# Patient Record
Sex: Male | Born: 1970 | Race: White | Hispanic: No | Marital: Married | Smoking: Never smoker
Health system: Southern US, Community
[De-identification: ages and names within clinical notes are randomized; demographics above are authoritative.]

## PROBLEM LIST (undated history)

## (undated) DIAGNOSIS — G609 Hereditary and idiopathic neuropathy, unspecified: Secondary | ICD-10-CM

## (undated) DIAGNOSIS — E785 Hyperlipidemia, unspecified: Secondary | ICD-10-CM

## (undated) DIAGNOSIS — Z87898 Personal history of other specified conditions: Secondary | ICD-10-CM

## (undated) HISTORY — DX: Hereditary and idiopathic neuropathy, unspecified: G60.9

## (undated) HISTORY — PX: EYE SURGERY: SHX253

## (undated) HISTORY — DX: Hyperlipidemia, unspecified: E78.5

## (undated) HISTORY — PX: APPENDECTOMY: SHX54

## (undated) HISTORY — PX: ANTERIOR CRUCIATE LIGAMENT REPAIR: SHX115

## (undated) HISTORY — DX: Personal history of other specified conditions: Z87.898

---

## 1998-05-22 ENCOUNTER — Ambulatory Visit (HOSPITAL_BASED_OUTPATIENT_CLINIC_OR_DEPARTMENT_OTHER): Admission: RE | Admit: 1998-05-22 | Discharge: 1998-05-22 | Payer: Self-pay | Admitting: Ophthalmology

## 2002-06-04 ENCOUNTER — Encounter: Payer: Self-pay | Admitting: Family Medicine

## 2002-06-04 ENCOUNTER — Encounter: Admission: RE | Admit: 2002-06-04 | Discharge: 2002-06-04 | Payer: Self-pay | Admitting: Family Medicine

## 2006-07-14 ENCOUNTER — Ambulatory Visit: Payer: Self-pay | Admitting: Family Medicine

## 2007-06-16 ENCOUNTER — Ambulatory Visit (HOSPITAL_COMMUNITY): Admission: RE | Admit: 2007-06-16 | Discharge: 2007-06-16 | Payer: Self-pay | Admitting: Orthopedic Surgery

## 2008-07-23 ENCOUNTER — Ambulatory Visit: Payer: Self-pay | Admitting: Family Medicine

## 2008-07-23 DIAGNOSIS — M25519 Pain in unspecified shoulder: Secondary | ICD-10-CM

## 2008-07-24 ENCOUNTER — Encounter: Payer: Self-pay | Admitting: Family Medicine

## 2008-07-24 LAB — CONVERTED CEMR LAB
ALT: 31 units/L (ref 0–53)
AST: 16 units/L (ref 0–37)
Albumin: 4.6 g/dL (ref 3.5–5.2)
Alkaline Phosphatase: 88 units/L (ref 39–117)
BUN: 11 mg/dL (ref 6–23)
CO2: 23 meq/L (ref 19–32)
Calcium: 9.2 mg/dL (ref 8.4–10.5)
Chloride: 103 meq/L (ref 96–112)
Cholesterol, target level: 200 mg/dL
Cholesterol: 231 mg/dL — ABNORMAL HIGH (ref 0–200)
Creatinine, Ser: 0.94 mg/dL (ref 0.40–1.50)
Glucose, Bld: 99 mg/dL (ref 70–99)
HDL goal, serum: 40 mg/dL
HDL: 34 mg/dL — ABNORMAL LOW (ref >39)
LDL Cholesterol: 123 mg/dL — ABNORMAL HIGH (ref 0–99)
LDL Goal: 160 mg/dL
Potassium: 4.4 meq/L (ref 3.5–5.3)
Sodium: 140 meq/L (ref 135–145)
TSH: 1.839 microintl units/mL (ref 0.350–4.50)
Total Bilirubin: 0.5 mg/dL (ref 0.3–1.2)
Total CHOL/HDL Ratio: 6.8
Total Protein: 7.5 g/dL (ref 6.0–8.3)
Triglycerides: 372 mg/dL — ABNORMAL HIGH (ref <150)
VLDL: 74 mg/dL — ABNORMAL HIGH (ref 0–40)

## 2016-06-18 ENCOUNTER — Encounter: Payer: Self-pay | Admitting: *Deleted

## 2016-06-18 ENCOUNTER — Emergency Department (INDEPENDENT_AMBULATORY_CARE_PROVIDER_SITE_OTHER): Payer: BC Managed Care – PPO

## 2016-06-18 ENCOUNTER — Emergency Department
Admission: EM | Admit: 2016-06-18 | Discharge: 2016-06-18 | Disposition: A | Discharge status: Home / Self Care | Payer: BC Managed Care – PPO | Source: Home / Self Care | Attending: Family Medicine | Admitting: Family Medicine

## 2016-06-18 DIAGNOSIS — N50812 Left testicular pain: Secondary | ICD-10-CM

## 2016-06-18 DIAGNOSIS — N433 Hydrocele, unspecified: Secondary | ICD-10-CM

## 2016-06-18 DIAGNOSIS — M545 Low back pain, unspecified: Secondary | ICD-10-CM

## 2016-06-18 DIAGNOSIS — N503 Cyst of epididymis: Secondary | ICD-10-CM

## 2016-06-18 LAB — POCT URINALYSIS DIP (MANUAL ENTRY)
Bilirubin, UA: NEGATIVE
Blood, UA: NEGATIVE
Glucose, UA: NEGATIVE
Ketones, POC UA: NEGATIVE
Leukocytes, UA: NEGATIVE
Nitrite, UA: NEGATIVE
Protein Ur, POC: NEGATIVE
Spec Grav, UA: 1.015 (ref 1.005–1.03)
Urobilinogen, UA: 0.2 (ref 0–1)
pH, UA: 8 (ref 5–8)

## 2016-06-18 NOTE — ED Notes (Signed)
Pt c/o lower left back pain x 2-3 days that radiates to his testicles. Denies any dysuria or hematuria or testicular swelling. Pain is worse in the evening and with raising his left leg.

## 2016-06-18 NOTE — Discharge Instructions (Signed)
You may alternate acetaminophen and ibuprofen for pain as well as alternate cool and warm compresses to help with discomfort.  Avoid heavy leg lifting or squats for 3-4 days, or until pain resolves to help with healing.  If not improving please follow up with your family doctor or Sports medicine for further evaluation.

## 2016-06-18 NOTE — ED Provider Notes (Signed)
CSN: [id number]     Arrival date & time 06/18/16  1021 History   First MD Initiated Contact with Patient 06/18/16 1027     Chief Complaint  Patient presents with  . Back Pain   (Consider location/radiation/quality/duration/timing/severity/associated sxs/prior Treatment) HPI  Devin Greene is a 45 y.o. male presenting to UC with c/o Left sided lower back pain as well as Left sided testicular pain for about 2-3 days. Pain described as a squeezing tight and sore sensation, has been constant since onset, waxing and waning, 4/10 at this time, worse with certain movements such as lifting Left leg, worsens pain in left testicle.  He reports going to the gym the other day prior to symptoms starting and lifted a new weight with a leg press and wonders if that caused his current pain. Advil provides moderate pain relief.  Denies pain with urination or hematuria. No prior hx of hernias or kidney stones. Denies fever, n/v/d.    History reviewed. No pertinent past medical history. Past Surgical History  Procedure Laterality Date  . Appendectomy    . Eye surgery    . Anterior cruciate ligament repair     Family History  Problem Relation Age of Onset  . Cancer Mother     Colon CA   Social History  Substance Use Topics  . Smoking status: Never Smoker   . Smokeless tobacco: Never Used  . Alcohol Use: Yes    Review of Systems  Constitutional: Negative for fever and chills.  Gastrointestinal: Negative for nausea, vomiting, abdominal pain and diarrhea.  Genitourinary: Positive for testicular pain (Left). Negative for dysuria, urgency, frequency, hematuria, flank pain, decreased urine volume, discharge, penile swelling, scrotal swelling and penile pain.  Musculoskeletal: Positive for myalgias and back pain ( Left lower). Negative for gait problem.  Skin: Negative for color change and wound.  Neurological: Negative for weakness and numbness.    Allergies  Review of patient's allergies indicates  no known allergies.  Home Medications   Prior to Admission medications   Not on File   Meds Ordered and Administered this Visit  Medications - No data to display  BP 143/89 mmHg  Pulse 72  Ht 5\' 7"  (1.702 m)  Wt 197 lb (89.359 kg)  BMI 30.85 kg/m2  SpO2 99% No data found.   Physical Exam  Constitutional: He is oriented to person, place, and time. He appears well-developed and well-nourished.  HENT:  Head: Normocephalic and atraumatic.  Eyes: EOM are normal.  Neck: Normal range of motion.  Cardiovascular: Normal rate.   Pulmonary/Chest: Effort normal.  Abdominal: Soft. He exhibits no distension and no mass. There is no tenderness. There is no rebound and no guarding. Hernia confirmed negative in the right inguinal area and confirmed negative in the left inguinal area.  Genitourinary: Penis normal. Right testis shows no mass, no swelling and no tenderness. Left testis shows tenderness. Left testis shows no mass and no swelling. Circumcised. No penile erythema or penile tenderness.  Musculoskeletal: Normal range of motion. He exhibits tenderness. He exhibits no edema.  No midline spinal tenderness. Tenderness to Left lower lumbar muscles. Negative straight leg raise. Full ROM upper and lower extremities with 5/5 strength. Normal gait.   Neurological: He is alert and oriented to person, place, and time.  Skin: Skin is warm and dry.  Psychiatric: He has a normal mood and affect. His behavior is normal.  Nursing note and vitals reviewed.   ED Course  Procedures (including critical care  time)  Labs Review Labs Reviewed  POCT URINALYSIS DIP (MANUAL ENTRY)    Imaging Review [place] Scrotum  06/18/2016  CLINICAL DATA:  Lower back pain that radiates to his testicles. Denies dysuria or hematuria. EXAM: ULTRASOUND OF SCROTUM TECHNIQUE: Complete ultrasound examination of the testicles, epididymis, and other scrotal structures was performed. COMPARISON:  None. FINDINGS: Right testicle  Measurements: 3.8 x 2.1 x 2.7 cm. No mass or microlithiasis visualized. Left testicle Measurements: 4.0 x 1.9 x 2.7 cm. No mass or microlithiasis visualized. Right epididymis: There is a benign appearing epididymal cyst measuring 8 x 6 x 6 mm. Left epididymis:  Normal in size and appearance. Hydrocele:  Bilateral hydroceles noted. Varicocele:  None visualized. IMPRESSION: 1. No evidence for testicular torsion or mass. 2. Bilateral hydroceles 3. Right epididymal cysts. Electronically Signed   By: Signa Kellaylor  Stroud M.D.   On: 06/18/2016 11:51   [place] Art/ven Flow Abd Pelv Doppler  06/18/2016  CLINICAL DATA:  Lower back pain that radiates to his testicles. Denies dysuria or hematuria. EXAM: ULTRASOUND OF SCROTUM TECHNIQUE: Complete ultrasound examination of the testicles, epididymis, and other scrotal structures was performed. COMPARISON:  None. FINDINGS: Right testicle Measurements: 3.8 x 2.1 x 2.7 cm. No mass or microlithiasis visualized. Left testicle Measurements: 4.0 x 1.9 x 2.7 cm. No mass or microlithiasis visualized. Right epididymis: There is a benign appearing epididymal cyst measuring 8 x 6 x 6 mm. Left epididymis:  Normal in size and appearance. Hydrocele:  Bilateral hydroceles noted. Varicocele:  None visualized. IMPRESSION: 1. No evidence for testicular torsion or mass. 2. Bilateral hydroceles 3. Right epididymal cysts. Electronically Signed   By: Signa Kellaylor  Stroud M.D.   On: 06/18/2016 11:51      MDM   1. Bilateral hydrocele   2. Left testicular pain   3. Left-sided low back pain without sciatica    Pt c/o Left lower back pain and Left sided testicular pain. No midline spinal tenderness. No red flag symptoms of numbness or weakness in Left leg. No gait change. No fevers. No change in bowel or bladder habits.  Tenderness to Left lumbar muscles and Left side of scrotum. No masses or hernias palpated.   U/S scrotum to r/o testicular torsion- negative for torsion. Significant for bilateral  hyrocele.  Pt denies concern for STDs or hx of prostate issues.  UA- normal, no evidence of infection or blood.  Reassured pt hydrocele will likely resolve on its own. May have muscle strain in lower back and groin. Avoid heavy lifting of lower extremities such as squats and leg press for 3-4 days/until pain resolves.  Encouraged alternating acetaminophen, ibuprofen, and cool and warm compresses. Home care instructions provided. F/u with PCP in 3-4 days if not improving. If pain worsens significantly this weekend, difficulty walking or urinating, or other new concerning symptoms developed, encouraged to go to emergency department. Patient verbalized understanding and agreement with treatment plan.   Junius Finnerrin O'Malley, PA-C 06/18/16 1227

## 2016-09-07 IMAGING — US US SCROTUM
1 series · 14 of 25 positions shown · non-contrast
Comparison: None.

CLINICAL DATA: Lower back pain that radiates to his testicles.
Denies dysuria or hematuria.

EXAM:
ULTRASOUND OF SCROTUM
TECHNIQUE: Complete ultrasound examination of the testicles, epididymis, and
other scrotal structures was performed.

[Series 1: us scrotum · 0.07mm/px · 14 of 66 slices shown]
[im 1/66]
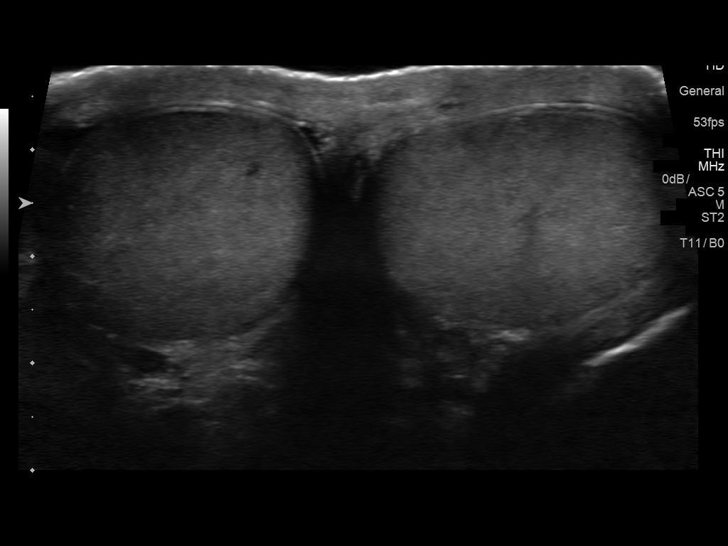
[im 6/66]
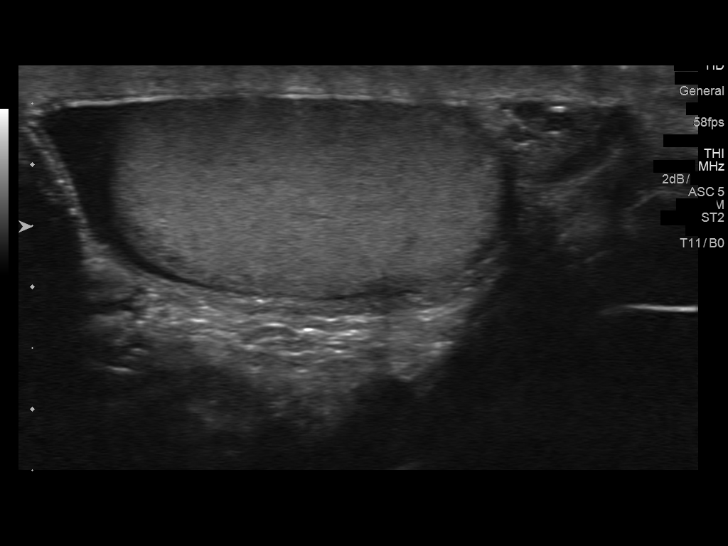
[im 11/66]
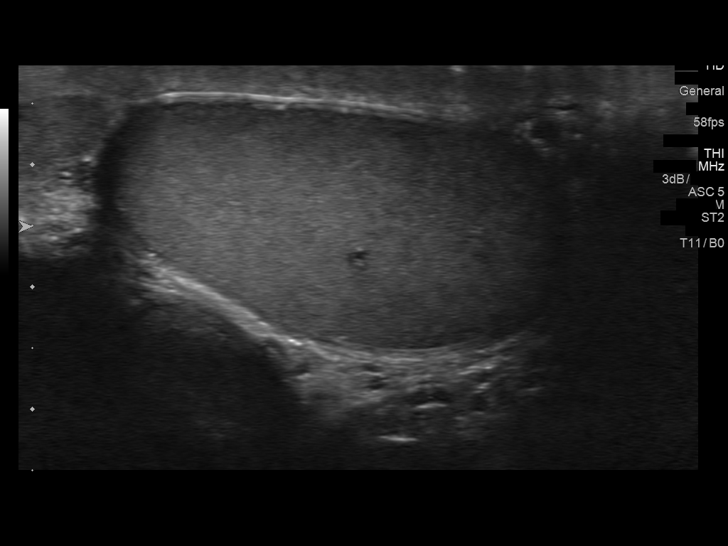
[im 17/66]
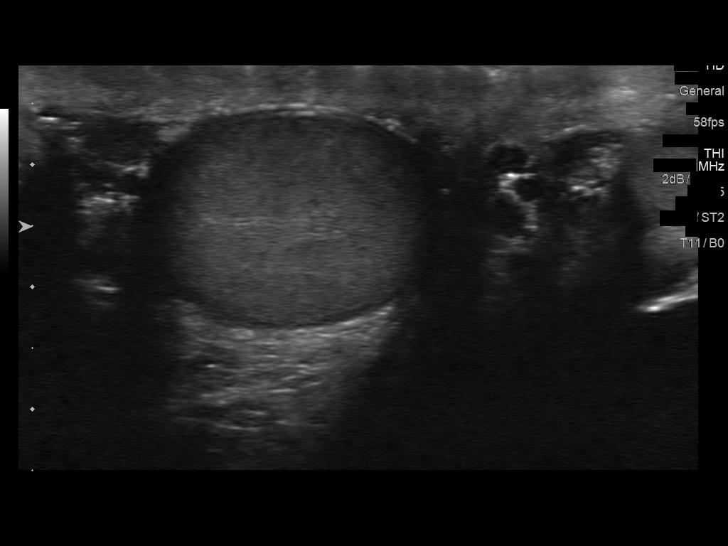
[im 22/66]
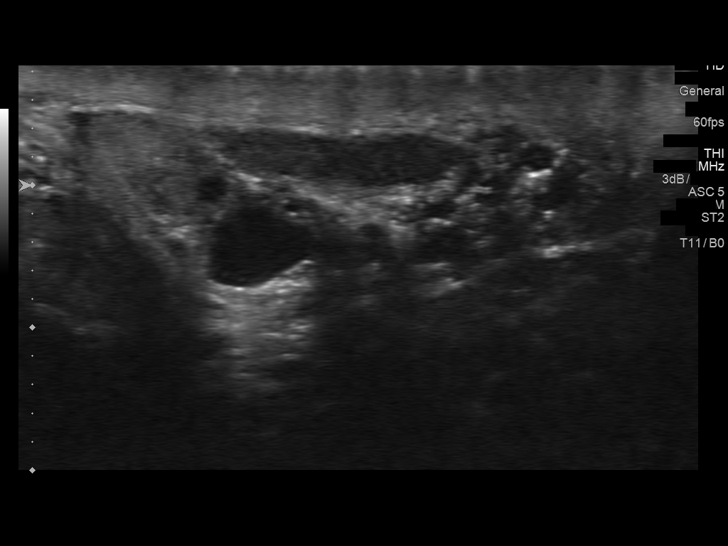
[im 25/66]
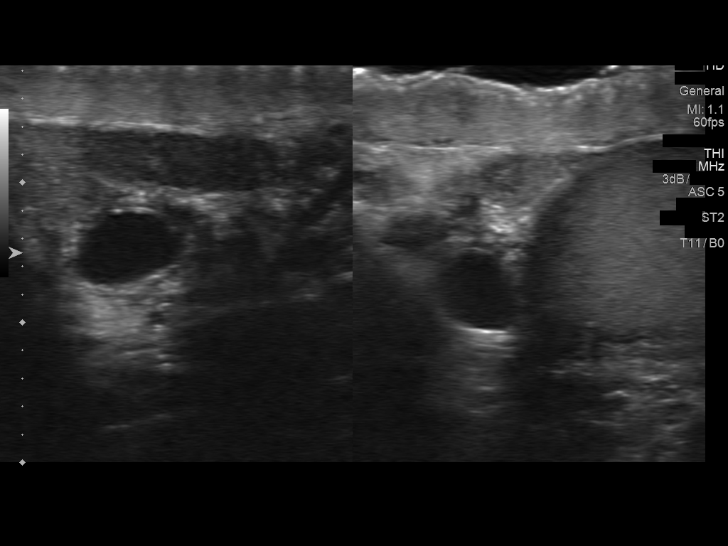
[im 30/66]
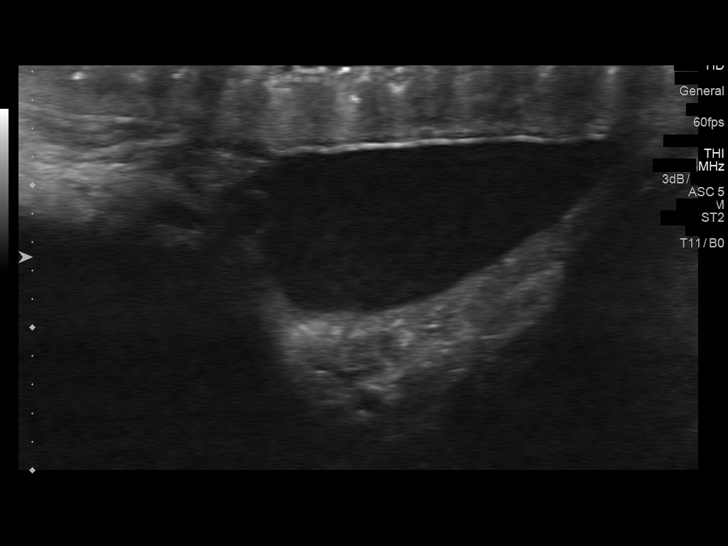
[im 36/66]
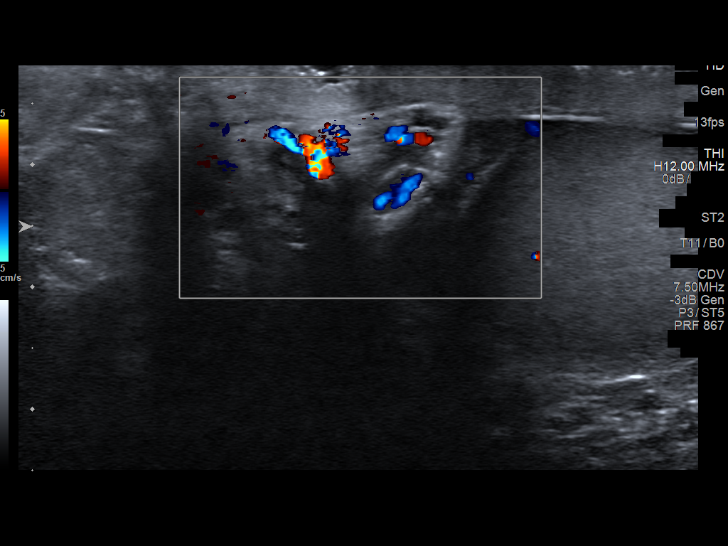
[im 41/66]
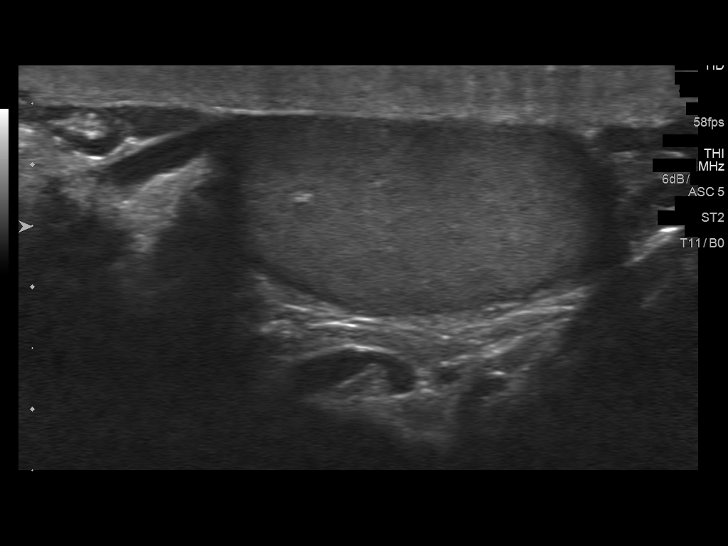
[im 44/66]
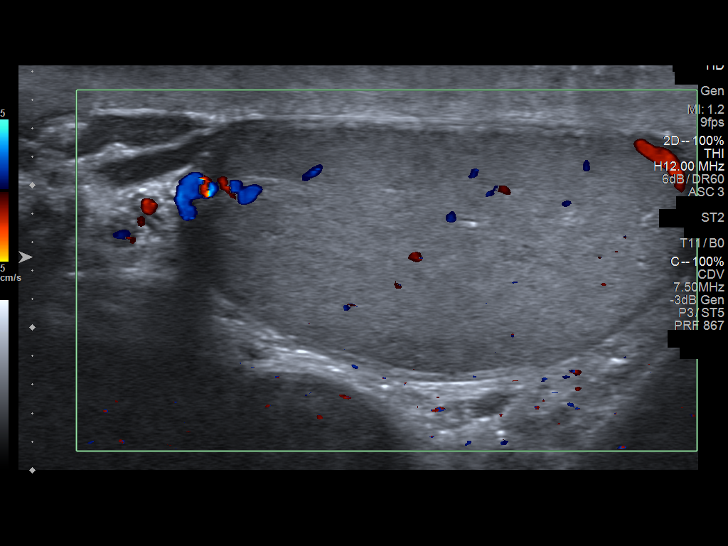
[im 49/66]
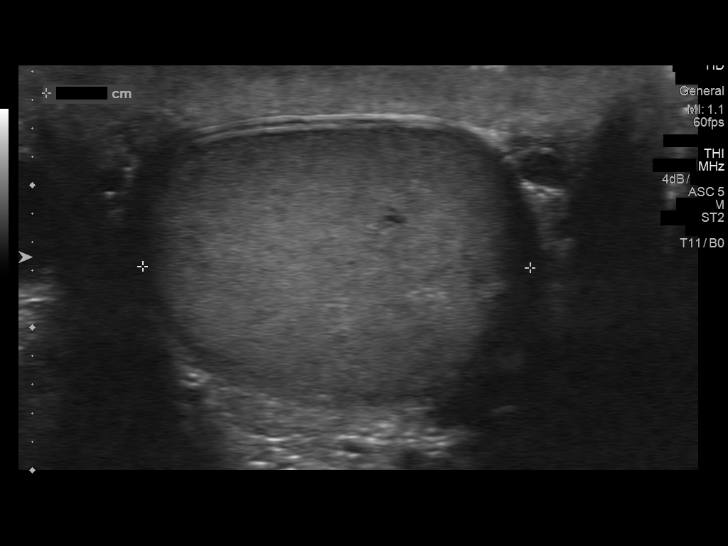
[im 55/66]
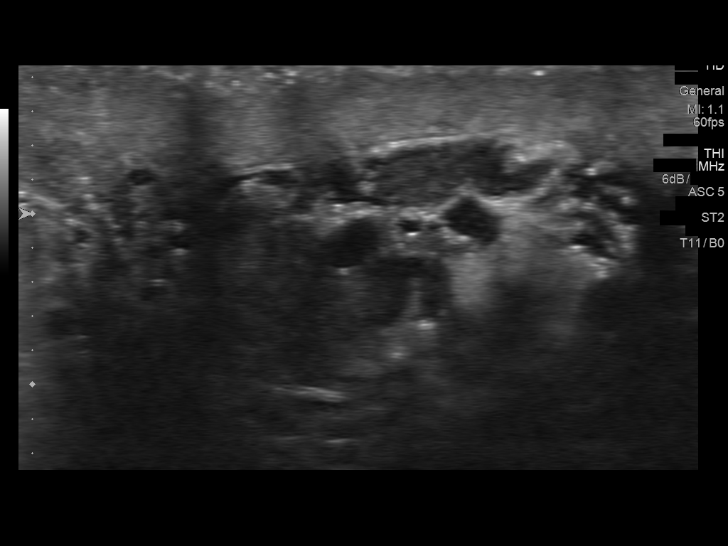
[im 60/66]
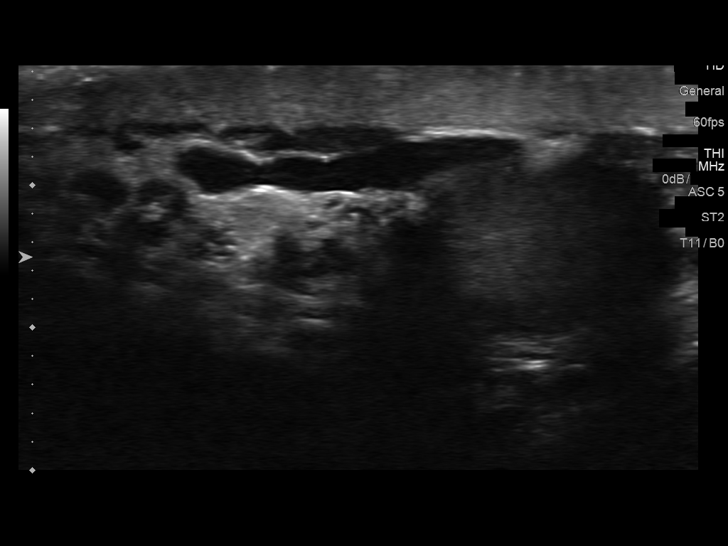
[im 66/66]
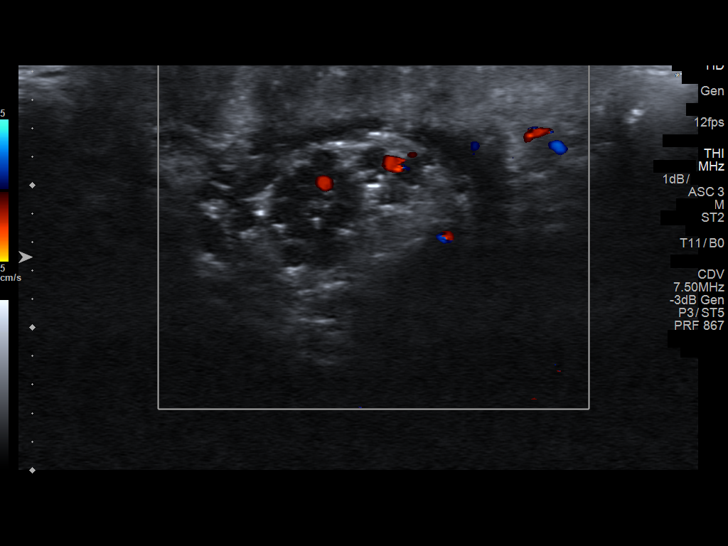

[14 of 25 positions shown; findings below may reference images not displayed]

FINDINGS: Right testicle

Measurements: 3.8 x 2.1 x 2.7 cm. No mass or microlithiasis
visualized.

Left testicle

Measurements: 4.0 x 1.9 x 2.7 cm. No mass or microlithiasis
visualized.

Right epididymis: There is a benign appearing epididymal cyst
measuring 8 x 6 x 6 mm.

Left epididymis:  Normal in size and appearance.

Hydrocele:  Bilateral hydroceles noted.

Varicocele:  None visualized.
IMPRESSION: 1. No evidence for testicular torsion or mass.
2. Bilateral hydroceles
3. Right epididymal cysts.

## 2020-01-02 ENCOUNTER — Ambulatory Visit: Payer: BC Managed Care – PPO | Admitting: Neurology

## 2020-02-08 ENCOUNTER — Other Ambulatory Visit: Payer: Self-pay

## 2020-02-08 ENCOUNTER — Encounter: Payer: Self-pay | Admitting: Neurology

## 2020-02-08 ENCOUNTER — Ambulatory Visit: Payer: BC Managed Care – PPO | Admitting: Neurology

## 2020-02-08 VITALS — BP 139/95 | HR 74 | Temp 97.7°F | Ht 66.0 in | Wt 189.0 lb

## 2020-02-08 DIAGNOSIS — G609 Hereditary and idiopathic neuropathy, unspecified: Secondary | ICD-10-CM | POA: Diagnosis not present

## 2020-02-08 NOTE — Patient Instructions (Addendum)
Blood work EMG/NCS   Peripheral Neuropathy Peripheral neuropathy is a type of nerve damage. It affects nerves that carry signals between the spinal cord and the arms, legs, and the rest of the body (peripheral nerves). It does not affect nerves in the spinal cord or brain. In peripheral neuropathy, one nerve or a group of nerves may be damaged. Peripheral neuropathy is a broad category that includes many specific nerve disorders, like diabetic neuropathy, hereditary neuropathy, and carpal tunnel syndrome. What are the causes? This condition may be caused by:  Diabetes. This is the most common cause of peripheral neuropathy.  Nerve injury.  Pressure or stress on a nerve that lasts a long time.  Lack (deficiency) of B vitamins. This can result from alcoholism, poor diet, or a restricted diet.  Infections.  Autoimmune diseases, such as rheumatoid arthritis and systemic lupus erythematosus.  Nerve diseases that are passed from parent to child (inherited).  Some medicines, such as cancer medicines (chemotherapy).  Poisonous (toxic) substances, such as lead and mercury.  Too little blood flowing to the legs.  Kidney disease.  Thyroid disease. In some cases, the cause of this condition is not known. What are the signs or symptoms? Symptoms of this condition depend on which of your nerves is damaged. Common symptoms include:  Loss of feeling (numbness) in the feet, hands, or both.  Tingling in the feet, hands, or both.  Burning pain.  Very sensitive skin.  Weakness.  Not being able to move a part of the body (paralysis).  Muscle twitching.  Clumsiness or poor coordination.  Loss of balance.  Not being able to control your bladder.  Feeling dizzy.  Sexual problems. How is this diagnosed? Diagnosing and finding the cause of peripheral neuropathy can be difficult. Your health care provider will take your medical history and do a physical exam. A neurological exam will  also be done. This involves checking things that are affected by your brain, spinal cord, and nerves (nervous system). For example, your health care provider will check your reflexes, how you move, and what you can feel. You may have other tests, such as:  Blood tests.  Electromyogram (EMG) and nerve conduction tests. These tests check nerve function and how well the nerves are controlling the muscles.  Imaging tests, such as CT scans or MRI to rule out other causes of your symptoms.  Removing a small piece of nerve to be examined in a lab (nerve biopsy). This is rare.  Removing and examining a small amount of the fluid that surrounds the brain and spinal cord (lumbar puncture). This is rare. How is this treated? Treatment for this condition may involve:  Treating the underlying cause of the neuropathy, such as diabetes, kidney disease, or vitamin deficiencies.  Stopping medicines that can cause neuropathy, such as chemotherapy.  Medicine to relieve pain. Medicines may include: ? Prescription or over-the-counter pain medicine. ? Antiseizure medicine. ? Antidepressants. ? Pain-relieving patches that are applied to painful areas of skin.  Surgery to relieve pressure on a nerve or to destroy a nerve that is causing pain.  Physical therapy to help improve movement and balance.  Devices to help you move around (assistive devices). Follow these instructions at home: Medicines  Take over-the-counter and prescription medicines only as told by your health care provider. Do not take any other medicines without first asking your health care provider.  Do not drive or use heavy machinery while taking prescription pain medicine. Lifestyle   Do not use  any products that contain nicotine or tobacco, such as cigarettes and e-cigarettes. Smoking keeps blood from reaching damaged nerves. If you need help quitting, ask your health care provider.  Avoid or limit alcohol. Too much alcohol can  cause a vitamin B deficiency, and vitamin B is needed for healthy nerves.  Eat a healthy diet. This includes: ? Eating foods that are high in fiber, such as fresh fruits and vegetables, whole grains, and beans. ? Limiting foods that are high in fat and processed sugars, such as fried or sweet foods. General instructions   If you have diabetes, work closely with your health care provider to keep your blood sugar under control.  If you have numbness in your feet: ? Check every day for signs of injury or infection. Watch for redness, warmth, and swelling. ? Wear padded socks and comfortable shoes. These help protect your feet.  Develop a good support system. Living with peripheral neuropathy can be stressful. Consider talking with a mental health specialist or joining a support group.  Use assistive devices and attend physical therapy as told by your health care provider. This may include using a walker or a cane.  Keep all follow-up visits as told by your health care provider. This is important. Contact a health care provider if:  You have new signs or symptoms of peripheral neuropathy.  You are struggling emotionally from dealing with peripheral neuropathy.  Your pain is not well-controlled. Get help right away if:  You have an injury or infection that is not healing normally.  You develop new weakness in an arm or leg.  You fall frequently. Summary  Peripheral neuropathy is when the nerves in the arms, or legs are damaged, resulting in numbness, weakness, or pain.  There are many causes of peripheral neuropathy, including diabetes, pinched nerves, vitamin deficiencies, autoimmune disease, and hereditary conditions.  Diagnosing and finding the cause of peripheral neuropathy can be difficult. Your health care provider will take your medical history, do a physical exam, and do tests, including blood tests and nerve function tests.  Treatment involves treating the underlying  cause of the neuropathy and taking medicines to help control pain. Physical therapy and assistive devices may also help. This information is not intended to replace advice given to you by your health care provider. Make sure you discuss any questions you have with your health care provider. Document Revised: 11/26/2017 Document Reviewed: 02/22/2017 Elsevier Patient Education  2020 [employer]    Electromyoneurogram Electromyoneurogram is a test to check how well your muscles and nerves are working. This procedure includes the combined use of electromyogram (EMG) and nerve conduction study (NCS). EMG is used to look for muscular disorders. NCS, which is also called electroneurogram, measures how well your nerves are controlling your muscles. The procedures are usually done together to check if your muscles and nerves are healthy. If the results of the tests are abnormal, this may indicate disease or injury, such as a neuromuscular disease or peripheral nerve damage. Tell a health care provider about:  Any allergies you have.  All medicines you are taking, including vitamins, herbs, eye drops, creams, and over-the-counter medicines.  Any problems you or family members have had with anesthetic medicines.  Any blood disorders you have.  Any surgeries you have had.  Any medical conditions you have.  If you have a pacemaker.  Whether you are pregnant or may be pregnant. What are the risks? Generally, this is a safe procedure. However, problems may occur,  including:  Infection where the electrodes were inserted.  Bleeding. What happens before the procedure? Medicines Ask your health care provider about:  Changing or stopping your regular medicines. This is especially important if you are taking diabetes medicines or blood thinners.  Taking medicines such as aspirin and ibuprofen. These medicines can thin your blood. Do not take these medicines unless your health care provider tells  you to take them.  Taking over-the-counter medicines, vitamins, herbs, and supplements. General instructions  Your health care provider may ask you to avoid: ? Beverages that have caffeine, such as coffee and tea. ? Any products that contain nicotine or tobacco. These products include cigarettes, e-cigarettes, and chewing tobacco. If you need help quitting, ask your health care provider.  Do not use lotions or creams on the same day that you will be having the procedure. What happens during the procedure? For EMG   Your health care provider will ask you to stay in a position so that he or she can access the muscle that will be studied. You may be standing, sitting, or lying down.  You may be given a medicine that numbs the area (local anesthetic).  A very thin needle that has an electrode will be inserted into your muscle.  Another small electrode will be placed on your skin near the muscle.  Your health care provider will ask you to continue to remain still.  The electrodes will send a signal that tells about the electrical activity of your muscles. You may see this on a monitor or hear it in the room.  After your muscles have been studied at rest, your health care provider will ask you to contract or flex your muscles. The electrodes will send a signal that tells about the electrical activity of your muscles.  Your health care provider will remove the electrodes and the electrode needles when the procedure is finished. The procedure may vary among health care providers and hospitals. For NCS   An electrode that records your nerve activity (recording electrode) will be placed on your skin by the muscle that is being studied.  An electrode that is used as a reference (reference electrode) will be placed near the recording electrode.  A paste or gel will be applied to your skin between the recording electrode and the reference electrode.  Your nerve will be stimulated with a  mild shock. Your health care provider will measure how much time it takes for your muscle to react.  Your health care provider will remove the electrodes and the gel when the procedure is finished. The procedure may vary among health care providers and hospitals. What happens after the procedure?  It is up to you to get the results of your procedure. Ask your health care provider, or the department that is doing the procedure, when your results will be ready.  Your health care provider may: ? Give you medicines for any pain. ? Monitor the insertion sites to make sure that bleeding stops. Summary  Electromyoneurogram is a test to check how well your muscles and nerves are working.  If the results of the tests are abnormal, this may indicate disease or injury.  This is a safe procedure. However, problems may occur, such as bleeding and infection.  Your health care provider will do two tests to complete this procedure. One checks your muscles (EMG) and another checks your nerves (NCS).  It is up to you to get the results of your procedure. Ask your health  care provider, or the department that is doing the procedure, when your results will be ready. This information is not intended to replace advice given to you by your health care provider. Make sure you discuss any questions you have with your health care provider. Document Revised: 08/30/2018 Document Reviewed: 08/12/2018 Elsevier Patient Education  2020 [employer].

## 2020-02-08 NOTE — Progress Notes (Signed)
[facility]    Provider:  Dr Lucia Gaskins Requesting Provider: Tally Joe, MD Primary Care Provider:  Agapito Games, MD  CC:  Sensory problems in the feet  HPI:  Devin Greene is a 49 y.o. male here as requested by Agapito Games, * for neuropathy. PMHx HLD, idiopathic neuropathy, prediabetes. HgbA1c 5.8 last per record review.  I reviewed Dr. Merita Norton notes, patient has a history of hyperlipidemia most recent LDL was 181 on atorvastatin which had been previously discontinued to see if it might help with his bilateral foot numbness but it did not, he continues to have numbness over the balls of his feet to his toes, sometimes he feels like he might fall because of the numbness, he denies any low back pain, he confirms he had an nerve conduction study and a normal vitamin B12 level previously checked since onset of symptoms, he has a history of prediabetes, EMG nerve conduction study in the past for idiopathic peripheral neuropathy, EMG was normal per report, MRI of the brain was also normal by report.  MRI of the head and MRA of the head February 2018 was normal.  He may have undiagnosed high blood pressure and he does not check his readings at home.  Rare alcohol.  Never smoked.  He is here alone and reports symptoms started a year ago maybe longer. He started feeling burning sensation in the toes and bottom of the feet started in one and then to the other, symmetrically. Now both big toes have decreased sensation. Stopped the statin and maybe he was improved then started taking another statin, maybe its in his head he got better. He also feels sensory changes left lateral lower leg. He has had severe testicular pain a year go it was severe and couldn't find anything, it went away, he has lost some weight and then gained some back like most others at this time. He has had low back pain. The symptoms are slowly progressive. He has had 2 ACL repairs years ago and has had  numbness in the lower left lateral leg but now burning. Very little alcohol use. Father and paternal grandmother have idiopathic neuropathy. He does have LBP but nothing radicular and not there now. Father now has complete numbness in the feet, no significant FHx of diabetes. No characteristics in the family that he has noticed such as high arches or flat feet. The pins and needles and burning and cooling feeling is not always but the numbness in the pad of the foot is always there also the big toe is more affected. He bends down a lot, he was in the electrical trade business, the burning and cool sensation are mostly at night like when laying in bed or sitting on the couch. Nothing known to exacerbate or improve. No weakness. No ignificant FHx of autoimmune disorder. No other focal neurologic deficits, associated symptoms, inciting events or modifiable factors.  Review of Systems: Patient complains of symptoms per HPI as well as the following symptoms: numbness in feet. Pertinent negatives and positives per HPI. All others negative.   Social History   Socioeconomic History  . Marital status: Married    Spouse name: Not on file  . Number of children: 1  . Years of education: Not on file  . Highest education level: Some college, no degree  Occupational History  . Not on file  Tobacco Use  . Smoking status: Never Smoker  . Smokeless tobacco: Former Neurosurgeon    Types:  Chew  Substance and Sexual Activity  . Alcohol use: Yes    Alcohol/week: 2.0 standard drinks    Types: 2 Cans of beer per week  . Drug use: No  . Sexual activity: Not on file  Other Topics Concern  . Not on file  Social History Narrative   Lives at home with wife. Son is in college.   Right handed   Caffeine: 2 cups of coffee usually in the morning   Social Determinants of Health   Financial Resource Strain:   . Difficulty of Paying Living Expenses: Not on file  Food Insecurity:   . Worried About Charity fundraiser in  the Last Year: Not on file  . Ran Out of Food in the Last Year: Not on file  Transportation Needs:   . Lack of Transportation (Medical): Not on file  . Lack of Transportation (Non-Medical): Not on file  Physical Activity:   . Days of Exercise per Week: Not on file  . Minutes of Exercise per Session: Not on file  Stress:   . Feeling of Stress : Not on file  Social Connections:   . Frequency of Communication with Friends and Family: Not on file  . Frequency of Social Gatherings with Friends and Family: Not on file  . Attends Religious Services: Not on file  . Active Member of Clubs or Organizations: Not on file  . Attends Archivist Meetings: Not on file  . Marital Status: Not on file  Intimate Partner Violence:   . Fear of Current or Ex-Partner: Not on file  . Emotionally Abused: Not on file  . Physically Abused: Not on file  . Sexually Abused: Not on file    Family History  Problem Relation Age of Onset  . Cancer Mother        Colon CA  . Liver cancer Mother        mets to colon  . Hyperlipidemia Mother   . Hyperlipidemia Father   . Hypertension Father   . Neuropathy Father   . Neuropathy Paternal Grandmother     Past Medical History:  Diagnosis Date  . History of prediabetes   . Hyperlipidemia   . Idiopathic peripheral neuropathy     Patient Active Problem List   Diagnosis Date Noted  . Idiopathic polyneuropathy 02/08/2020  . SHOULDER PAIN, LEFT 07/23/2008    Past Surgical History:  Procedure Laterality Date  . ANTERIOR CRUCIATE LIGAMENT REPAIR Left    x2  . APPENDECTOMY    . EYE SURGERY      Current Outpatient Medications  Medication Sig Dispense Refill  . Cholecalciferol (VITAMIN D3) 125 MCG (5000 UT) TABS Take by mouth.    . Magnesium Citrate 200 MG TABS Take by mouth.    . Multiple Vitamin (MULTIVITAMIN PO) Take by mouth.    . Omega-3 Fatty Acids (FISH OIL) 1000 MG CAPS Take 1 capsule by mouth daily.    Marland Kitchen OVER THE COUNTER MEDICATION Take  1,200 mg by mouth 2 (two) times daily. Berberine    . rosuvastatin (CRESTOR) 5 MG tablet Take 5 mg by mouth once a week.    Marland Kitchen Ubiquinol 100 MG CAPS Take by mouth 2 (two) times daily.     No current facility-administered medications for this visit.    Allergies as of 02/08/2020 - Review Complete 02/08/2020  Allergen Reaction Noted  . Levofloxacin Other (See Comments) 08/05/2016    Vitals: BP (!) 139/95 (BP Location: Left Arm, Patient Position:  Sitting)   Pulse 74   Temp 97.7 F (36.5 C) Comment: taken at front  Ht 5\' 6"  (1.676 m)   Wt 189 lb (85.7 kg)   BMI 30.51 kg/m  Last Weight:  Wt Readings from Last 1 Encounters:  02/08/20 189 lb (85.7 kg)   Last Height:   Ht Readings from Last 1 Encounters:  02/08/20 5\' 6"  (1.676 m)     Physical exam: Exam: Gen: NAD, conversant, well nourised,  well groomed                     CV: RRR, no MRG. No Carotid Bruits. No peripheral edema, warm, nontender Eyes: Conjunctivae clear without exudates or hemorrhage  Neuro: Detailed Neurologic Exam  Speech:    Speech is normal; fluent and spontaneous with normal comprehension.  Cognition:    The patient is oriented to person, place, and time;     recent and remote memory intact;     language fluent;     normal attention, concentration,     fund of knowledge Cranial Nerves:    The pupils are equal, round, and reactive to light. The fundi are normal and spontaneous venous pulsations are present. Visual fields are full to finger confrontation. Extraocular movements are intact. Trigeminal sensation is intact and the muscles of mastication are normal. The face is symmetric. The palate elevates in the midline. Hearing intact. Voice is normal. Shoulder shrug is normal. The tongue has normal motion without fasciculations.   Coordination:    Normal finger to nose and heel to shin. Normal rapid alternating movements.   Gait:    Heel-toe and tandem gait are normal.   Motor Observation:    No  asymmetry, no atrophy, and no involuntary movements noted. Tone:    Normal muscle tone.    Posture:    Posture is normal. normal erect    Strength:    Strength is V/V in the upper and lower limbs.      Sensation: intact to LT, pin prick, vibration distally      Reflex Exam:  DTR's:    Deep tendon reflexes in the upper and lower extremities are normal bilaterally.   Toes:    The toes are downgoing bilaterally.   Clonus:    Clonus is absent.    Assessment/Plan: This is a very nice 49 year old patient here for paresthesias and numbness in the toes and balls of his feet, ongoing for over a year, he really has no significant past medical history he has high cholesterol and very minimal prediabetes at 5.8.  He has tried stopping his statin to see if that would help.  He has never smoked, no significant alcohol use.  He does have a family history however of neuropathy in his father and paternal grandfather, idiopathic, no risk factors such as diabetes in his family per patient.  I wonder if this could be genetic neuropathy.  He has had a normal EMG nerve conduction study in the past, I have ordered this test but in the meantime I will request data for this past procedure to review.  We will perform a thorough blood work evaluation.  I did discuss genetic testing if we do not find anything, and we will wait and see what the blood work shows, genetic testing does have its own pitfalls obviously, sometimes we find abnormalities in genes that we have no idea what it means or if they are significant and may not even be related to numbness  in the feet, also it is hard to know if in the future he will have any insurance issues if he is diagnosed with a genetic disorder, I did discuss all this in have him think about it.  -May be genetic, history of idiopathic neuropathy in his father and paternal grandfather -Already had an EMG nerve conduction study, was normal by report, will request data patient to  call us back with the name of the physician we have already had him sign a medical release form -Perform extensive lab testing today see if we find anything -If negative I discussed genetic testing with him and we can offer that pending lab results.  Orders Placed This Encounter  Procedures  . TSH  . HIV Antibody (routine testing w rflx)  . Sedimentation rate  . Sjogren's syndrome antibods(ssa + ssb)  . Pan-ANCA  . B12 and Folate Panel  . RPR  . Hepatitis C antibody  . Rheumatoid factor  . Heavy metals, blood  . Vitamin B6  . Vitamin B1  . Methylmalonic acid, serum  . ANA Comprehensive Panel  . NCV with EMG(electromyography)     Cc: Agapito Games, *,  Tally Joe, MD  Naomie Dean, MD  [facility] [address] [place], [place] [address]  Phone [phone] Fax [phone]

## 2020-02-10 NOTE — Progress Notes (Signed)
Labs normal so far, still awaiting a few thanks Dr. Lucia Gaskins

## 2020-02-15 LAB — ANA COMPREHENSIVE PANEL
Anti JO-1: <0.2 AI (ref 0.0–0.9)
Centromere Ab Screen: <0.2 AI (ref 0.0–0.9)
Chromatin Ab SerPl-aCnc: <0.2 AI (ref 0.0–0.9)
ENA RNP Ab: <0.2 AI (ref 0.0–0.9)
ENA SM Ab Ser-aCnc: <0.2 AI (ref 0.0–0.9)
ENA SSA (RO) Ab: <0.2 AI (ref 0.0–0.9)
ENA SSB (LA) Ab: <0.2 AI (ref 0.0–0.9)
Scleroderma (Scl-70) (ENA) Antibody, IgG: <0.2 AI (ref 0.0–0.9)
dsDNA Ab: <1 IU/mL (ref 0–9)

## 2020-02-15 LAB — HEAVY METALS, BLOOD
Arsenic: 5 ug/L (ref 2–23)
Lead, Blood: 1 ug/dL (ref 0–4)
Mercury: <1 ug/L (ref 0.0–14.9)

## 2020-02-15 LAB — TSH: TSH: 1.28 u[IU]/mL (ref 0.450–4.500)

## 2020-02-15 LAB — PAN-ANCA
ANCA Proteinase 3: <3.5 U/mL (ref 0.0–3.5)
Atypical pANCA: <1:20 {titer}
C-ANCA: <1:20 {titer}
Myeloperoxidase Ab: <9 U/mL (ref 0.0–9.0)
P-ANCA: <1:20 {titer}

## 2020-02-15 LAB — METHYLMALONIC ACID, SERUM: Methylmalonic Acid: 105 nmol/L (ref 0–378)

## 2020-02-15 LAB — HEPATITIS C ANTIBODY: Hep C Virus Ab: <0.1 s/co ratio (ref 0.0–0.9)

## 2020-02-15 LAB — VITAMIN B1: Thiamine: 140.4 nmol/L (ref 66.5–200.0)

## 2020-02-15 LAB — VITAMIN B6: Vitamin B6: 12.9 ug/L (ref 5.3–46.7)

## 2020-02-15 LAB — SEDIMENTATION RATE: Sed Rate: 5 mm/hr (ref 0–15)

## 2020-02-15 LAB — B12 AND FOLATE PANEL
Folate: 12.4 ng/mL (ref >3.0)
Vitamin B-12: 497 pg/mL (ref 232–1245)

## 2020-02-15 LAB — RHEUMATOID FACTOR: Rheumatoid fact SerPl-aCnc: <10 IU/mL (ref 0.0–13.9)

## 2020-02-15 LAB — RPR: RPR Ser Ql: NONREACTIVE

## 2020-02-15 LAB — HIV ANTIBODY (ROUTINE TESTING W REFLEX): HIV Screen 4th Generation wRfx: NONREACTIVE

## 2020-02-15 NOTE — Progress Notes (Signed)
Labs are normal, we discussed genetic testing. We can discuss at your emg/ncs. thanks

## 2020-02-19 ENCOUNTER — Telehealth: Payer: Self-pay | Admitting: Neurology

## 2020-02-19 NOTE — Telephone Encounter (Signed)
I called patient and LVM regarding scheduling NCV/EMG.
